# Patient Record
Sex: Male | Born: 1970 | Race: White | Hispanic: No | Marital: Married | Smoking: Former smoker
Health system: Southern US, Community
[De-identification: ages and names within clinical notes are randomized; demographics above are authoritative.]

## PROBLEM LIST (undated history)

## (undated) DIAGNOSIS — Z0282 Encounter for adoption services: Secondary | ICD-10-CM

## (undated) DIAGNOSIS — F1911 Other psychoactive substance abuse, in remission: Secondary | ICD-10-CM

## (undated) HISTORY — PX: LASIK: SHX215

---

## 1999-01-26 ENCOUNTER — Ambulatory Visit: Admission: RE | Admit: 1999-01-26 | Discharge: 1999-01-26 | Payer: Self-pay | Admitting: Otolaryngology

## 1999-02-27 ENCOUNTER — Encounter: Payer: Self-pay | Admitting: Emergency Medicine

## 1999-02-27 ENCOUNTER — Emergency Department (HOSPITAL_COMMUNITY): Admission: EM | Admit: 1999-02-27 | Discharge: 1999-02-27 | Payer: Self-pay | Admitting: Emergency Medicine

## 2000-08-06 ENCOUNTER — Ambulatory Visit (HOSPITAL_BASED_OUTPATIENT_CLINIC_OR_DEPARTMENT_OTHER): Admission: RE | Admit: 2000-08-06 | Discharge: 2000-08-06 | Payer: Self-pay | Admitting: Otolaryngology

## 2000-09-07 ENCOUNTER — Emergency Department (HOSPITAL_COMMUNITY): Admission: EM | Admit: 2000-09-07 | Discharge: 2000-09-07 | Payer: Self-pay | Admitting: Emergency Medicine

## 2003-10-12 ENCOUNTER — Observation Stay (HOSPITAL_COMMUNITY): Admission: AD | Admit: 2003-10-12 | Discharge: 2003-10-13 | Payer: Self-pay | Admitting: Orthopedic Surgery

## 2003-10-12 ENCOUNTER — Encounter (INDEPENDENT_AMBULATORY_CARE_PROVIDER_SITE_OTHER): Payer: Self-pay | Admitting: *Deleted

## 2003-12-12 ENCOUNTER — Emergency Department (HOSPITAL_COMMUNITY): Admission: EM | Admit: 2003-12-12 | Discharge: 2003-12-12 | Payer: Self-pay | Admitting: Emergency Medicine

## 2003-12-22 ENCOUNTER — Emergency Department (HOSPITAL_COMMUNITY): Admission: EM | Admit: 2003-12-22 | Discharge: 2003-12-23 | Payer: Self-pay | Admitting: Emergency Medicine

## 2003-12-23 ENCOUNTER — Ambulatory Visit (HOSPITAL_COMMUNITY): Admission: RE | Admit: 2003-12-23 | Discharge: 2003-12-23 | Payer: Self-pay | Admitting: Emergency Medicine

## 2005-11-21 ENCOUNTER — Emergency Department (HOSPITAL_COMMUNITY): Admission: EM | Admit: 2005-11-21 | Discharge: 2005-11-21 | Payer: Self-pay | Admitting: Emergency Medicine

## 2005-12-23 ENCOUNTER — Emergency Department (HOSPITAL_COMMUNITY): Admission: EM | Admit: 2005-12-23 | Discharge: 2005-12-23 | Payer: Self-pay | Admitting: Emergency Medicine

## 2005-12-26 ENCOUNTER — Emergency Department (HOSPITAL_COMMUNITY): Admission: EM | Admit: 2005-12-26 | Discharge: 2005-12-26 | Payer: Self-pay | Admitting: Emergency Medicine

## 2006-08-26 ENCOUNTER — Emergency Department (HOSPITAL_COMMUNITY): Admission: EM | Admit: 2006-08-26 | Discharge: 2006-08-26 | Payer: Self-pay | Admitting: Emergency Medicine

## 2009-10-24 ENCOUNTER — Emergency Department (HOSPITAL_COMMUNITY): Admission: EM | Admit: 2009-10-24 | Discharge: 2009-10-24 | Payer: Self-pay | Admitting: Emergency Medicine

## 2010-06-10 NOTE — Op Note (Signed)
[facility]  Patient:    [name], [name]                       MRN: [id number] Proc. Date: 08/06/00 Adm. Date:  [id number] Attending:  Carlean Purl                           Operative Report  PREOPERATIVE DIAGNOSIS:  Right preauricular cyst.  POSTOPERATIVE DIAGNOSIS:  Right preauricular cyst.  OPERATION:  Excision of right preauricular cyst.  SURGEON:  Kristine Garbe. Ezzard Standing, M.D.  ANESTHESIA:  Local 1% Xylocaine with 1:100,000 epinephrine.  COMPLICATIONS:  None.  INDICATIONS:  Devon Young is a 40 year old  gentleman who has had a small cyst with a small fistulous track in front of the right tragus.  He is taken to the operating room at this time for an excision of the right preauricular cyst.  DESCRIPTION OF PROCEDURE:  The right preauricular area was prepped with Betadine solution and was then injected with 3 cc of Xylocaine with epinephrine.  The fistulous track and nodular cyst was elliptically excised. A #4-0 chromic suture was used to ligate a branch of the temporal artery.  The defect was then closed with #4-0 chromic sutures subcutaneously, and Steri-Strips on the skin.  DISPOSITION:  [name] tolerated the procedure well, and is discharged home, on Tylenol p.r.n. pain.  Will come to our office if he has any problems. DD:  08/06/00 TD:  08/06/00 Job: [id number]

## 2010-06-10 NOTE — Op Note (Signed)
Devon Young, Devon Young                          ACCOUNT NO.:  [id number]   MEDICAL RECORD NO.:  [id number]                   PATIENT TYPE:  OBV   LOCATION:  1610                                 FACILITY:  [facility]   PHYSICIAN:  Marlowe Kays, M.D.               DATE OF BIRTH:  1971/01/20   DATE OF PROCEDURE:  10/12/2003  DATE OF DISCHARGE:                                 OPERATIVE REPORT   PREOPERATIVE DIAGNOSES:  Herniated nucleus pulposus L5-S1, left.   POSTOPERATIVE DIAGNOSES:  Herniated nucleus pulposus L5-S1, left.   OPERATION:  Microdiskectomy L5-S1, left.   SURGEON:  Marlowe Kays, M.D.   ASSISTANT:  Georges Lynch. Darrelyn Hillock, M.D.   ANESTHESIA:  General.   PATHOLOGY AND JUSTIFICATION FOR PROCEDURE:  Longstanding chronic left leg  pain with some numbness over the anterior thigh.  He had an MRI roughly six  to nine months ago which demonstrated disk abnormality L5-S1 on the left but  more recently he has had more in the way of pain with some numbness over the  anterior thigh and I repeated the MRI on October 09, 2003 which showed a  moderately large left sided disk protrusion increased in size from the  previous study of February 05, 2003  and demonstrating compression of the S1  nerve root.  Accordingly he is here today for surgery.   DESCRIPTION OF PROCEDURE:  Prophylactic antibiotics, satisfactory general  anesthesia, knee chest position on the Andrew's frame, back was prepped with  Duraprep and partially draped in a sterile field with two spinal needles on  the lateral x-ray. I tentatively identified the L5-S1 interspace. The  superior needle was just above the L5-S1 disk and the inferior needle over  the sacrum.  We finished draping the back in a sterile field, Ioban  employed, vertical midline incision.  I dissected the soft tissue off the  lamina of L5 and the sacrum which because of his anatomy we felt clearly  defined and no additional x-ray was anticipated.  I placed  a self retaining  McCullough retractor. I then undermined the fascia over the superior portion  of the sacrum and used a 2 mm first and then 3 mm Kerrison rongeurs doing a  wide foraminotomy.  Then working laterally removing bone on ligamentum  flavum. I then brought in the microscope and completed the lateral  decompression. The S1 nerve root was identified, retracted medially and the  large disk herniation noted. Some veins around it were coagulated with  bipolar cautery. The interspace was opened with a 15 knife blade and a large  amount of disk material came forth under pressure. These two major disk  fragments were identified and removed. I then began removing all disk  material obtainable from the interspace with a combination of nerve hook,  Epstein curette and straight and angled upbite pituitary rongeurs. We worked  over the sacrum and also cephalad carefully removing  disk material until all  obtainable from the interspace was removed.  The nerve root was noted to be  bruised laterally with a slight indentation in it consistent with his pain  and sensory loss.  The wound was then irrigated with sterile saline. He was  given 30 mg of Toradol IV.  Gelfoam soaked in thrombin was placed over the  interspace and around the nerve. Self retaining retractors were removed,  there was no unusual bleeding.  I closed the fascia with interrupted #1  Vicryl and infiltrated the subcutaneous tissue with 0.5%  plain Marcaine.  The closure was completed with interrupted #1 Vicryl in the  subcutaneous tissue deep.  2-0 Vicryl superficially and staples in the skin.  Betadine Adaptic dry sterile dressing were applied. He was gently placed on  his bed and taken to the recovery room in satisfactory condition with no  known complications.      JA/MEDQ  D:  10/12/2003  T:  10/13/2003  Job:  [id number]

## 2010-11-07 LAB — CBC
Hemoglobin: 14.6
MCHC: 33.8
RBC: 4.79

## 2010-11-07 LAB — I-STAT 8, (EC8 V) (CONVERTED LAB)
BUN: 19
Chloride: 105
Glucose, Bld: 137 — ABNORMAL HIGH
Potassium: 4.4
TCO2: 30
pH, Ven: 7.353 — ABNORMAL HIGH

## 2010-11-07 LAB — DIFFERENTIAL
Basophils Relative: 1
Lymphs Abs: 1.8
Monocytes Absolute: 0.5
Monocytes Relative: 9
Neutro Abs: 2.9

## 2010-11-07 LAB — RAPID URINE DRUG SCREEN, HOSP PERFORMED
Amphetamines: NOT DETECTED
Barbiturates: NOT DETECTED
Benzodiazepines: POSITIVE — AB
Cocaine: NOT DETECTED
Opiates: POSITIVE — AB
Tetrahydrocannabinol: NOT DETECTED

## 2012-05-27 ENCOUNTER — Other Ambulatory Visit: Payer: Self-pay | Admitting: Orthopedic Surgery

## 2012-05-27 ENCOUNTER — Encounter (HOSPITAL_COMMUNITY): Payer: Self-pay | Admitting: Pharmacy Technician

## 2012-05-31 ENCOUNTER — Inpatient Hospital Stay (HOSPITAL_COMMUNITY): Admission: RE | Admit: 2012-05-31 | Payer: Self-pay | Source: Ambulatory Visit

## 2012-06-03 ENCOUNTER — Encounter (HOSPITAL_COMMUNITY)
Admission: RE | Admit: 2012-06-03 | Discharge: 2012-06-03 | Disposition: A | Discharge status: Home / Self Care | Payer: BC Managed Care – PPO | Source: Ambulatory Visit | Attending: Orthopedic Surgery | Admitting: Orthopedic Surgery

## 2012-06-03 ENCOUNTER — Encounter (HOSPITAL_COMMUNITY): Payer: Self-pay

## 2012-06-03 DIAGNOSIS — Z789 Other specified health status: Secondary | ICD-10-CM

## 2012-06-03 DIAGNOSIS — Z0282 Encounter for adoption services: Secondary | ICD-10-CM

## 2012-06-03 HISTORY — PX: BACK SURGERY: SHX140

## 2012-06-03 HISTORY — PX: OTHER SURGICAL HISTORY: SHX169

## 2012-06-03 HISTORY — DX: Other psychoactive substance abuse, in remission: F19.11

## 2012-06-03 HISTORY — DX: Encounter for adoption services: Z02.82

## 2012-06-03 HISTORY — DX: Other specified health status: Z78.9

## 2012-06-03 LAB — SURGICAL PCR SCREEN
MRSA, PCR: NEGATIVE
Staphylococcus aureus: POSITIVE — AB

## 2012-06-03 NOTE — Pre-Procedure Instructions (Signed)
06-03-12 1610 Pt. Left voice  Message to inform of Positive Staph aureus PCR screen-ask to return call to confirm. Use Mupirocin as instructed and bring to hospital. W. Tyner Codner,RN.

## 2012-06-03 NOTE — Patient Instructions (Addendum)
[name]  06/03/2012   Your procedure is scheduled on:   5-13 -2014  Report to [facility] at      1:00 PM.  Call this number if you have problems the morning of surgery: [phone]  Or Presurgical Testing [phone](Amya Hlad)     Do not eat food:After Midnight.  May have clear liquids:up to 6 Hours before arrival. Nothing after :  0900 AM  Clear liquids include soda, tea, black coffee, apple or grape juice, broth.  Take these medicines the morning of surgery with A SIP OF WATER: Methadone   Do not wear jewelry, make-up or nail polish.  Do not wear lotions, powders, or perfumes. You may wear deodorant.  Do not shave 12 hours prior to first CHG shower(legs and under arms).(face and neck okay.)  Do not bring valuables to the hospital.  Contacts, dentures or bridgework,body piercing,  may not be worn into surgery.  Leave suitcase in the car. After surgery it may be brought to your room.  For patients admitted to the hospital, checkout time is 11:00 AM the day of discharge.   Patients discharged the day of surgery will not be allowed to drive home. Must have responsible person with you x 24 hours once discharged.  Name and phone number of your driver: marque, rademaker [phone] cell  Special Instructions: CHG(Chlorhedine 4%-"Hibiclens","Betasept","Aplicare") Shower Use Special Wash: see special instructions.(avoid face and genitals)   Please read over the following fact sheets that you were given: MRSA Information.   Failure to follow these instructions may result in Cancellation of your surgery.   Patient signature_______________________________________________________

## 2012-06-04 ENCOUNTER — Encounter (HOSPITAL_COMMUNITY)
Admission: RE | Disposition: A | Discharge status: Home / Self Care | Payer: Self-pay | Source: Ambulatory Visit | Attending: Orthopedic Surgery

## 2012-06-04 ENCOUNTER — Encounter (HOSPITAL_COMMUNITY): Payer: Self-pay | Admitting: *Deleted

## 2012-06-04 ENCOUNTER — Encounter (HOSPITAL_COMMUNITY): Payer: Self-pay | Admitting: Anesthesiology

## 2012-06-04 ENCOUNTER — Ambulatory Visit (HOSPITAL_COMMUNITY): Payer: BC Managed Care – PPO

## 2012-06-04 ENCOUNTER — Observation Stay (HOSPITAL_COMMUNITY)
Admission: RE | Admit: 2012-06-04 | Discharge: 2012-06-06 | Disposition: A | Discharge status: Home / Self Care | Payer: BC Managed Care – PPO | Source: Ambulatory Visit | Attending: Orthopedic Surgery | Admitting: Orthopedic Surgery

## 2012-06-04 ENCOUNTER — Ambulatory Visit: Admit: 2012-06-04 | Payer: Self-pay | Admitting: Orthopedic Surgery

## 2012-06-04 ENCOUNTER — Ambulatory Visit (HOSPITAL_COMMUNITY): Payer: BC Managed Care – PPO | Admitting: Anesthesiology

## 2012-06-04 DIAGNOSIS — M5126 Other intervertebral disc displacement, lumbar region: Principal | ICD-10-CM | POA: Insufficient documentation

## 2012-06-04 DIAGNOSIS — R209 Unspecified disturbances of skin sensation: Secondary | ICD-10-CM | POA: Insufficient documentation

## 2012-06-04 DIAGNOSIS — M79609 Pain in unspecified limb: Secondary | ICD-10-CM | POA: Insufficient documentation

## 2012-06-04 DIAGNOSIS — Z9889 Other specified postprocedural states: Secondary | ICD-10-CM

## 2012-06-04 HISTORY — PX: LUMBAR LAMINECTOMY: SHX95

## 2012-06-04 SURGERY — MICRODISCECTOMY LUMBAR LAMINECTOMY
Anesthesia: General | Site: Back | Laterality: Left | Wound class: Clean

## 2012-06-04 SURGERY — LUMBAR LAMINECTOMY/DECOMPRESSION MICRODISCECTOMY 1 LEVEL
Anesthesia: General | Site: Back | Laterality: Left

## 2012-06-04 MED ORDER — SUCCINYLCHOLINE CHLORIDE 20 MG/ML IJ SOLN
INTRAMUSCULAR | Status: DC | PRN
  Administered 2012-06-04: 160 mg via INTRAVENOUS

## 2012-06-04 MED ORDER — HYDROMORPHONE HCL PF 1 MG/ML IJ SOLN
INTRAMUSCULAR | Status: AC
  Filled 2012-06-04: qty 1

## 2012-06-04 MED ORDER — KETOROLAC TROMETHAMINE 30 MG/ML IJ SOLN: 30.0000 mg | Freq: Once | INTRAMUSCULAR | Status: DC

## 2012-06-04 MED ORDER — DIAZEPAM 5 MG/ML IJ SOLN
5.0000 mg | INTRAMUSCULAR | Status: DC | PRN
  Administered 2012-06-04 (×2): 5 mg via INTRAVENOUS

## 2012-06-04 MED ORDER — DEXTROSE IN LACTATED RINGERS 5 % IV SOLN
INTRAVENOUS | Status: DC
  Administered 2012-06-04: 19:00:00 via INTRAVENOUS

## 2012-06-04 MED ORDER — PHENOL 1.4 % MT LIQD: 1.0000 | OROMUCOSAL | Status: DC | PRN

## 2012-06-04 MED ORDER — OXYCODONE HCL 5 MG PO TABS
5.0000 mg | ORAL_TABLET | ORAL | Status: DC | PRN
  Administered 2012-06-04: 10 mg via ORAL
  Administered 2012-06-05 – 2012-06-06 (×10): 15 mg via ORAL
  Filled 2012-06-04 (×2): qty 3
  Filled 2012-06-04: qty 2
  Filled 2012-06-04 (×8): qty 3

## 2012-06-04 MED ORDER — THROMBIN 5000 UNITS EX SOLR
CUTANEOUS | Status: DC | PRN
  Administered 2012-06-04: 10000 [IU] via TOPICAL

## 2012-06-04 MED ORDER — EPHEDRINE SULFATE 50 MG/ML IJ SOLN
INTRAMUSCULAR | Status: DC | PRN
  Administered 2012-06-04: 10 mg via INTRAVENOUS
  Administered 2012-06-04: 5 mg via INTRAVENOUS

## 2012-06-04 MED ORDER — SODIUM CHLORIDE 0.9 % IV SOLN: 250.0000 mL | INTRAVENOUS | Status: DC

## 2012-06-04 MED ORDER — DEXTROSE 5 % IV SOLN
500.0000 mg | Freq: Four times a day (QID) | INTRAVENOUS | Status: DC | PRN
  Filled 2012-06-04: qty 5

## 2012-06-04 MED ORDER — HEMOSTATIC AGENTS (NO CHARGE) OPTIME
TOPICAL | Status: DC | PRN
  Administered 2012-06-04: 1 via TOPICAL

## 2012-06-04 MED ORDER — ONDANSETRON HCL 4 MG/2ML IJ SOLN
INTRAMUSCULAR | Status: DC | PRN
  Administered 2012-06-04: 4 mg via INTRAVENOUS

## 2012-06-04 MED ORDER — KCL IN DEXTROSE-NACL 20-5-0.9 MEQ/L-%-% IV SOLN
INTRAVENOUS | Status: DC
  Administered 2012-06-04 – 2012-06-05 (×2): via INTRAVENOUS
  Filled 2012-06-04 (×6): qty 1000

## 2012-06-04 MED ORDER — ACETAMINOPHEN 10 MG/ML IV SOLN
INTRAVENOUS | Status: AC
  Filled 2012-06-04: qty 100

## 2012-06-04 MED ORDER — LACTATED RINGERS IV SOLN
INTRAVENOUS | Status: DC | PRN
  Administered 2012-06-04 (×2): via INTRAVENOUS

## 2012-06-04 MED ORDER — MEPERIDINE HCL 50 MG/ML IJ SOLN
6.2500 mg | INTRAMUSCULAR | Status: DC | PRN
  Administered 2012-06-04 (×2): 12.5 mg via INTRAVENOUS

## 2012-06-04 MED ORDER — ONDANSETRON HCL 4 MG/2ML IJ SOLN: 4.0000 mg | INTRAMUSCULAR | Status: DC | PRN

## 2012-06-04 MED ORDER — MENTHOL 3 MG MT LOZG: 1.0000 | LOZENGE | OROMUCOSAL | Status: DC | PRN

## 2012-06-04 MED ORDER — GLYCOPYRROLATE 0.2 MG/ML IJ SOLN
INTRAMUSCULAR | Status: DC | PRN
  Administered 2012-06-04: 0.4 mg via INTRAVENOUS

## 2012-06-04 MED ORDER — KETAMINE HCL 10 MG/ML IJ SOLN
INTRAMUSCULAR | Status: DC | PRN
  Administered 2012-06-04: 20 mg via INTRAVENOUS

## 2012-06-04 MED ORDER — METHOCARBAMOL 500 MG PO TABS
500.0000 mg | ORAL_TABLET | Freq: Four times a day (QID) | ORAL | Status: DC | PRN
  Administered 2012-06-05 (×4): 500 mg via ORAL
  Filled 2012-06-04 (×4): qty 1

## 2012-06-04 MED ORDER — MUPIROCIN 2 % EX OINT
TOPICAL_OINTMENT | CUTANEOUS | Status: AC
  Filled 2012-06-04: qty 22

## 2012-06-04 MED ORDER — SODIUM CHLORIDE 0.9 % IJ SOLN: 3.0000 mL | INTRAMUSCULAR | Status: DC | PRN

## 2012-06-04 MED ORDER — PROPOFOL 10 MG/ML IV EMUL
INTRAVENOUS | Status: DC | PRN
  Administered 2012-06-04: 200 mg via INTRAVENOUS

## 2012-06-04 MED ORDER — METHADONE HCL 10 MG PO TABS
100.0000 mg | ORAL_TABLET | Freq: Every day | ORAL | Status: DC
  Administered 2012-06-05 – 2012-06-06 (×2): 100 mg via ORAL
  Filled 2012-06-04: qty 1
  Filled 2012-06-04: qty 10
  Filled 2012-06-04: qty 9

## 2012-06-04 MED ORDER — CEFAZOLIN SODIUM-DEXTROSE 2-3 GM-% IV SOLR
INTRAVENOUS | Status: AC
  Filled 2012-06-04: qty 50

## 2012-06-04 MED ORDER — DEXTROSE 5 % IV SOLN
3.0000 g | INTRAVENOUS | Status: DC
  Administered 2012-06-04: 3 g via INTRAVENOUS

## 2012-06-04 MED ORDER — METHADONE HCL 10 MG/ML PO CONC: 100.0000 mg | Freq: Every day | ORAL | Status: DC

## 2012-06-04 MED ORDER — OXYCODONE HCL 5 MG/5ML PO SOLN
5.0000 mg | Freq: Once | ORAL | Status: DC | PRN
  Filled 2012-06-04: qty 5

## 2012-06-04 MED ORDER — SODIUM CHLORIDE 0.9 % IJ SOLN: 3.0000 mL | Freq: Two times a day (BID) | INTRAMUSCULAR | Status: DC

## 2012-06-04 MED ORDER — HYDROMORPHONE HCL PF 1 MG/ML IJ SOLN
0.2500 mg | INTRAMUSCULAR | Status: DC | PRN
  Administered 2012-06-04 (×4): 0.5 mg via INTRAVENOUS

## 2012-06-04 MED ORDER — LACTATED RINGERS IV SOLN
INTRAVENOUS | Status: DC
  Administered 2012-06-04: 1000 mL via INTRAVENOUS

## 2012-06-04 MED ORDER — CISATRACURIUM BESYLATE (PF) 10 MG/5ML IV SOLN
INTRAVENOUS | Status: DC | PRN
  Administered 2012-06-04 (×2): 1 mg via INTRAVENOUS
  Administered 2012-06-04: 10 mg via INTRAVENOUS
  Administered 2012-06-04: 1 mg via INTRAVENOUS

## 2012-06-04 MED ORDER — MIDAZOLAM HCL 5 MG/5ML IJ SOLN
INTRAMUSCULAR | Status: DC | PRN
  Administered 2012-06-04: 2 mg via INTRAVENOUS

## 2012-06-04 MED ORDER — HYDROMORPHONE HCL PF 1 MG/ML IJ SOLN
INTRAMUSCULAR | Status: DC | PRN
  Administered 2012-06-04: 1 mg via INTRAVENOUS
  Administered 2012-06-04 (×2): 0.5 mg via INTRAVENOUS

## 2012-06-04 MED ORDER — OXYCODONE HCL 5 MG PO TABS: 5.0000 mg | ORAL_TABLET | Freq: Once | ORAL | Status: DC | PRN

## 2012-06-04 MED ORDER — ADULT MULTIVITAMIN W/MINERALS CH
1.0000 | ORAL_TABLET | Freq: Every day | ORAL | Status: DC
  Administered 2012-06-04 – 2012-06-05 (×2): 1 via ORAL
  Filled 2012-06-04 (×3): qty 1

## 2012-06-04 MED ORDER — 0.9 % SODIUM CHLORIDE (POUR BTL) OPTIME
TOPICAL | Status: DC | PRN
  Administered 2012-06-04: 1000 mL

## 2012-06-04 MED ORDER — THROMBIN 5000 UNITS EX SOLR
CUTANEOUS | Status: AC
  Filled 2012-06-04: qty 10000

## 2012-06-04 MED ORDER — NEOSTIGMINE METHYLSULFATE 1 MG/ML IJ SOLN
INTRAMUSCULAR | Status: DC | PRN
  Administered 2012-06-04: 3 mg via INTRAVENOUS

## 2012-06-04 MED ORDER — DIAZEPAM 5 MG/ML IJ SOLN
INTRAMUSCULAR | Status: AC
  Filled 2012-06-04: qty 2

## 2012-06-04 MED ORDER — MEPERIDINE HCL 50 MG/ML IJ SOLN
INTRAMUSCULAR | Status: AC
  Filled 2012-06-04: qty 1

## 2012-06-04 MED ORDER — HYDROMORPHONE HCL PF 1 MG/ML IJ SOLN
0.5000 mg | INTRAMUSCULAR | Status: DC | PRN
  Administered 2012-06-04 – 2012-06-05 (×4): 1 mg via INTRAVENOUS
  Administered 2012-06-05: 0.5 mg via INTRAVENOUS
  Administered 2012-06-05 – 2012-06-06 (×5): 1 mg via INTRAVENOUS
  Filled 2012-06-04 (×10): qty 1

## 2012-06-04 MED ORDER — CEFAZOLIN SODIUM-DEXTROSE 2-3 GM-% IV SOLR
2.0000 g | Freq: Three times a day (TID) | INTRAVENOUS | Status: AC
  Administered 2012-06-05 (×2): 2 g via INTRAVENOUS
  Filled 2012-06-04 (×2): qty 50

## 2012-06-04 MED ORDER — POVIDONE-IODINE 10 % EX SOLN
CUTANEOUS | Status: DC | PRN
  Administered 2012-06-04: 1 via TOPICAL

## 2012-06-04 MED ORDER — CEFAZOLIN SODIUM 1-5 GM-% IV SOLN
INTRAVENOUS | Status: AC
  Filled 2012-06-04: qty 50

## 2012-06-04 MED ORDER — FENTANYL CITRATE 0.05 MG/ML IJ SOLN
INTRAMUSCULAR | Status: DC | PRN
  Administered 2012-06-04: 25 ug via INTRAVENOUS
  Administered 2012-06-04: 100 ug via INTRAVENOUS
  Administered 2012-06-04 (×3): 25 ug via INTRAVENOUS

## 2012-06-04 MED ORDER — PROMETHAZINE HCL 25 MG/ML IJ SOLN: 6.2500 mg | INTRAMUSCULAR | Status: DC | PRN

## 2012-06-04 MED ORDER — POVIDONE-IODINE 7.5 % EX SOLN: Freq: Once | CUTANEOUS | Status: DC

## 2012-06-04 MED ORDER — MUPIROCIN 2 % EX OINT
TOPICAL_OINTMENT | Freq: Two times a day (BID) | CUTANEOUS | Status: DC
  Administered 2012-06-04: 14:00:00 via NASAL

## 2012-06-04 SURGICAL SUPPLY — 44 items
BAG ZIPLOCK 12X15 (MISCELLANEOUS) ×2 IMPLANT
BENZOIN TINCTURE PRP APPL 2/3 (GAUZE/BANDAGES/DRESSINGS) ×2 IMPLANT
BUR EGG DIAMOND 4.0 (BURR) ×2 IMPLANT
CLEANER TIP ELECTROSURG 2X2 (MISCELLANEOUS) ×2 IMPLANT
CLOTH BEACON ORANGE TIMEOUT ST (SAFETY) ×2 IMPLANT
CONT SPEC 4OZ CLIKSEAL STRL BL (MISCELLANEOUS) ×2 IMPLANT
DRAIN PENROSE 18X1/4 LTX STRL (WOUND CARE) IMPLANT
DRAPE LG THREE QUARTER DISP (DRAPES) ×2 IMPLANT
DRAPE MICROSCOPE LEICA (MISCELLANEOUS) ×2 IMPLANT
DRAPE POUCH INSTRU U-SHP 10X18 (DRAPES) ×2 IMPLANT
DRAPE SURG 17X11 SM STRL (DRAPES) ×2 IMPLANT
DRSG ADAPTIC 3X8 NADH LF (GAUZE/BANDAGES/DRESSINGS) ×2 IMPLANT
DRSG PAD ABDOMINAL 8X10 ST (GAUZE/BANDAGES/DRESSINGS) ×2 IMPLANT
DURAPREP 26ML APPLICATOR (WOUND CARE) ×2 IMPLANT
ELECT BLADE TIP CTD 4 INCH (ELECTRODE) ×2 IMPLANT
ELECT REM PT RETURN 9FT ADLT (ELECTROSURGICAL) ×2
ELECTRODE REM PT RTRN 9FT ADLT (ELECTROSURGICAL) ×1 IMPLANT
GLOVE BIO SURGEON STRL SZ8 (GLOVE) ×4 IMPLANT
GLOVE BIOGEL PI IND STRL 8 (GLOVE) ×2 IMPLANT
GLOVE BIOGEL PI INDICATOR 8 (GLOVE) ×2
GLOVE ECLIPSE 8.0 STRL XLNG CF (GLOVE) ×4 IMPLANT
GLOVE INDICATOR 8.0 STRL GRN (GLOVE) ×2 IMPLANT
GOWN BRE IMP PREV XXLGXLNG (GOWN DISPOSABLE) ×4 IMPLANT
GOWN STRL REIN XL XLG (GOWN DISPOSABLE) ×4 IMPLANT
KIT BASIN OR (CUSTOM PROCEDURE TRAY) ×2 IMPLANT
KIT POSITIONING SURG ANDREWS (MISCELLANEOUS) ×2 IMPLANT
MANIFOLD NEPTUNE II (INSTRUMENTS) ×2 IMPLANT
NEEDLE SPNL 18GX3.5 QUINCKE PK (NEEDLE) ×6 IMPLANT
NS IRRIG 1000ML POUR BTL (IV SOLUTION) ×2 IMPLANT
PATTIES SURGICAL .5 X.5 (GAUZE/BANDAGES/DRESSINGS) IMPLANT
PATTIES SURGICAL .75X.75 (GAUZE/BANDAGES/DRESSINGS) IMPLANT
PATTIES SURGICAL 1X1 (DISPOSABLE) IMPLANT
POSITIONER SURGICAL ARM (MISCELLANEOUS) ×2 IMPLANT
SPONGE GAUZE 4X4 12PLY (GAUZE/BANDAGES/DRESSINGS) ×2 IMPLANT
SPONGE LAP 4X18 X RAY DECT (DISPOSABLE) ×4 IMPLANT
SPONGE SURGIFOAM ABS GEL 100 (HEMOSTASIS) ×2 IMPLANT
STAPLER VISISTAT 35W (STAPLE) ×2 IMPLANT
SUT VIC AB 1 CT1 27 (SUTURE) ×2
SUT VIC AB 1 CT1 27XBRD ANTBC (SUTURE) ×2 IMPLANT
SUT VIC AB 2-0 CT1 27 (SUTURE) ×2
SUT VIC AB 2-0 CT1 27XBRD (SUTURE) ×2 IMPLANT
TAPE CLOTH SURG 6X10 WHT LF (GAUZE/BANDAGES/DRESSINGS) ×2 IMPLANT
TRAY LAMINECTOMY (CUSTOM PROCEDURE TRAY) ×2 IMPLANT
WATER STERILE IRR 1500ML POUR (IV SOLUTION) ×2 IMPLANT

## 2012-06-04 NOTE — Anesthesia Preprocedure Evaluation (Addendum)
Anesthesia Evaluation  Patient identified by MRN, date of birth, ID band Patient awake    Reviewed: Allergy & Precautions, H&P , NPO status , Patient's Chart, lab work & pertinent test results  Airway Mallampati: I TM Distance: >3 FB Neck ROM: Full    Dental  (+) Dental Advisory Given   Pulmonary neg pulmonary ROS,  breath sounds clear to auscultation        Cardiovascular negative cardio ROS  Rhythm:Regular Rate:Normal     Neuro/Psych negative neurological ROS  negative psych ROS   GI/Hepatic negative GI ROS, Neg liver ROS,   Endo/Other  negative endocrine ROS  Renal/GU negative Renal ROS     Musculoskeletal negative musculoskeletal ROS (+)   Abdominal   Peds  Hematology negative hematology ROS (+)   Anesthesia Other Findings   Reproductive/Obstetrics                          Anesthesia Physical Anesthesia Plan  ASA: II  Anesthesia Plan: General   Post-op Pain Management:    Induction: Intravenous  Airway Management Planned: Oral ETT  Additional Equipment:   Intra-op Plan:   Post-operative Plan: Extubation in OR  Informed Consent: I have reviewed the patients History and Physical, chart, labs and discussed the procedure including the risks, benefits and alternatives for the proposed anesthesia with the patient or authorized representative who has indicated his/her understanding and acceptance.   Dental advisory given  Plan Discussed with: CRNA  Anesthesia Plan Comments:         Anesthesia Quick Evaluation

## 2012-06-04 NOTE — Progress Notes (Signed)
Patient arrived in short stay for his surgery saying he was up all night with diarrhea and feels very weak. He does not feel like having his surgery today. Temp 98. [place] to come and see the patient.
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
## 2012-06-04 NOTE — Anesthesia Postprocedure Evaluation (Signed)
Anesthesia Post Note  Patient: Devon Young  Procedure(s) Performed: Procedure(s) (LRB): MICRODISCECTOMY L5-S1 LEFT    (1 LEVEL) (Left)  Anesthesia type: General  Patient location: PACU  Post pain: Pain level controlled  Post assessment: Post-op Vital signs reviewed  Last Vitals: BP 103/68  Pulse 94  Temp(Src) 37.2 C (Oral)  Resp 14  SpO2 96%  Post vital signs: Reviewed  Level of consciousness: sedated  Complications: No apparent anesthesia complications

## 2012-06-04 NOTE — Transfer of Care (Signed)
Immediate Anesthesia Transfer of Care Note  Patient: Devon Young  Procedure(s) Performed: Procedure(s): MICRODISCECTOMY L5-S1 LEFT    (1 LEVEL) (Left)  Patient Location: PACU  Anesthesia Type:General  Level of Consciousness: awake, alert , oriented and patient cooperative  Airway & Oxygen Therapy: Patient Spontanous Breathing and Patient connected to face mask oxygen  Post-op Assessment: Report given to PACU RN and Post -op Vital signs reviewed and stable  Post vital signs: stable  Complications: No apparent anesthesia complications

## 2012-06-05 ENCOUNTER — Encounter (HOSPITAL_COMMUNITY): Payer: Self-pay | Admitting: Orthopedic Surgery

## 2012-06-05 MED ORDER — SENNOSIDES-DOCUSATE SODIUM 8.6-50 MG PO TABS
1.0000 | ORAL_TABLET | Freq: Two times a day (BID) | ORAL | Status: DC
  Administered 2012-06-05 – 2012-06-06 (×3): 1 via ORAL
  Filled 2012-06-05 (×4): qty 1

## 2012-06-05 MED ORDER — DOCUSATE SODIUM 100 MG PO CAPS: 100.0000 mg | ORAL_CAPSULE | Freq: Two times a day (BID) | ORAL | Status: DC

## 2012-06-05 NOTE — Care Management Note (Signed)
    Page 1 of 2   06/06/2012     4:14:21 PM   CARE MANAGEMENT NOTE 06/06/2012  Patient:  Devon Young,Devon Young   Account Number:  [id number]  Date Initiated:  06/05/2012  Documentation initiated by:  Colleen Can  Subjective/Objective Assessment:   dx recurrent herniated disc L5-S1; microdiskectomy     Action/Plan:   CM spoke with patient and pt's mother. Plans are for patient to go to his mother's home where she will be caregiver upon his discharge from hospital. He already has RW but will need 3n1. Wants HH agency in network .   Anticipated DC Date:  06/06/2012   Anticipated DC Plan:  HOME W HOME HEALTH SERVICES  In-house referral  Clinical Social Worker      DC Planning Services  CM consult      PAC Choice  DURABLE MEDICAL EQUIPMENT  HOME HEALTH   Choice offered to / List presented to:  C-1 Patient   DME arranged  3-N-1      DME agency  [facility]     HH arranged  HH-2 PT      [facility] agency  [facility]   Status of service:  Completed, signed off Medicare Important Message given?   (If response is "NO", the following Medicare IM given date fields will be blank) Date Medicare IM given:   Date Additional Medicare IM given:    Discharge Disposition:  HOME W HOME HEALTH SERVICES  Per UR Regulation:  Reviewed for med. necessity/level of care/duration of stay  If discussed at Long Length of Stay Meetings, dates discussed:    Comments:  06/06/2012 Colleen Can BSN RN CCM [phone] Genevieve Norlander will provide HHpt services for patient. DME has been delivered to pt's room. Pt discharged today

## 2012-06-05 NOTE — Evaluation (Signed)
Occupational Therapy Evaluation Patient Details Name: Devon Young MRN: [id number] DOB: July 23, 1970 Today's Date: 06/05/2012 Time: 1191-4782 OT Time Calculation (min): 32 min  OT Assessment / Plan / Recommendation Clinical Impression  This 42 year old man was admitted for L5-S1 decompression.  He has a h/o previous surgery 10 years ago.  He was limited by pain at eval and back education was initiated.  He will benefit from continued OT to continue education for safely performing adls and bathroom transfers and to increase independence with these.    OT Assessment  Patient needs continued OT Services    Follow Up Recommendations  No OT follow up (likely.  Needs continued OT in acute)    Barriers to Discharge      Equipment Recommendations  3 in 1 bedside comode    Recommendations for Other Services    Frequency  Min 2X/week    Precautions / Restrictions Precautions Precautions: Back Restrictions Weight Bearing Restrictions: Yes Other Position/Activity Restrictions: wbat   Pertinent Vitals/Pain 7 to 10/10 back; repositioned and pain medication requested.  Pt was premedicated    ADL  Toilet Transfer: Simulated;Minimal assistance (bed to chair, extra time with RW) Toilet Transfer Method: Stand pivot Transfers/Ambulation Related to ADLs: Pt anxious and still with 7- 10 pain.  Moved very slowly but well, min A overall for bed mobility and transfer ADL Comments: Began education for back precautions with adls.  Will need reinforcement.  ADLs limited due to pain.  Pt left sitting in chair and shoulders elevated/guarded.  RN called for pain medication.  Pt requires overall mod A for UB adls and max A for LB adls at this time.      OT Diagnosis: Generalized weakness  OT Problem List: Decreased strength;Decreased activity tolerance;Pain;Decreased knowledge of use of DME or AE;Decreased knowledge of precautions OT Treatment Interventions: Self-care/ADL training;DME and/or AE  instruction;Patient/family education   OT Goals Acute Rehab OT Goals OT Goal Formulation: With patient Time For Goal Achievement: 06/12/12 Potential to Achieve Goals: Good ADL Goals Pt Will Perform Lower Body Bathing: with min assist;Sit to stand from chair;with adaptive equipment ADL Goal: Lower Body Bathing - Progress: Goal set today Pt Will Perform Lower Body Dressing: with min assist;Sit to stand from chair;with adaptive equipment ADL Goal: Lower Body Dressing - Progress: Goal set today Pt Will Transfer to Toilet: with supervision;Ambulation;3-in-1 ADL Goal: Toilet Transfer - Progress: Goal set today Pt Will Perform Toileting - Hygiene: with supervision;Standing at 3-in-1/toilet;with adaptive equipment (prn) ADL Goal: Toileting - Hygiene - Progress: Goal set today Miscellaneous OT Goals Miscellaneous OT Goal #1: Pt will verbalize tub readiness/sequence OT Goal: Miscellaneous Goal #1 - Progress: Goal set today  Visit Information  Last OT Received On: 06/05/12 Assistance Needed: +1    Subjective Data  Subjective: It still hurts.  I can still feel the nerve Patient Stated Goal: get better, decrease pain   Prior Functioning     Home Living Lives With: Alone Available Help at Discharge: Family Type of Home: House Home Access: Stairs to enter Secretary/administrator of Steps: 2 Home Layout: One level Bathroom Shower/Tub: Engineer, manufacturing systems: Standard Additional Comments: plans to stay with mother Prior Function Level of Independence: Independent         Vision/Perception     Cognition  Cognition Arousal/Alertness: Awake/alert Behavior During Therapy: Anxious Overall Cognitive Status: Within Functional Limits for tasks assessed    Extremity/Trunk Assessment Right Upper Extremity Assessment RUE ROM/Strength/Tone: [facility] for tasks assessed Left Upper Extremity Assessment  LUE ROM/Strength/Tone: WFL for tasks assessed     Mobility Bed Mobility Bed  Mobility: Left Sidelying to Sit;Rolling Left Rolling Left: 4: Min assist;With rail Left Sidelying to Sit: 4: Min assist;With rails;HOB flat Transfers Transfers: Sit to Stand Sit to Stand: 4: Min assist     Exercise     Balance     End of Session OT - End of Session Activity Tolerance: Patient limited by pain Patient left: in chair;with call bell/phone within reach Nurse Communication: Patient requests pain meds  GO Functional Assessment Tool Used: clinical judgment Functional Limitation: Self care Self Care Current Status (W0981): At least 80 percent but less than 100 percent impaired, limited or restricted Self Care Goal Status (X9147): At least 20 percent but less than 40 percent impaired, limited or restricted   [facility] 06/05/2012, 9:23 AM Marica Otter, OTR/L [phone] 06/05/2012

## 2012-06-05 NOTE — Progress Notes (Signed)
Subjective: 1 Day Post-Op Procedure(s) (LRB): MICRODISCECTOMY L5-S1 LEFT    (1 LEVEL) (Left) Patient reports pain as 6 on 0-10 scale.    Objective: Vital signs in last 24 hours: Temp:  [98 F (36.7 C)-100.6 F (38.1 C)] 100.6 F (38.1 C) (05/14 0951) Pulse Rate:  [73-100] 93 (05/14 0951) Resp:  [12-18] 16 (05/14 0951) BP: (99-129)/(61-76) 99/61 mmHg (05/14 0951) SpO2:  [94 %-100 %] 95 % (05/14 0951) Weight:  [111.131 kg (245 lb)] 111.131 kg (245 lb) (05/14 0630)  Intake/Output from previous day: 05/13 0701 - 05/14 0700 In: 2680 [I.V.:2630; IV Piggyback:50] Out: 875 [Urine:850; Blood:25] Intake/Output this shift: Total I/O In: 120 [P.O.:120] Out: 300 [Urine:300]  No results found for this basename: HGB,  in the last 72 hours No results found for this basename: WBC, RBC, HCT, PLT,  in the last 72 hours No results found for this basename: NA, K, CL, CO2, BUN, CREATININE, GLUCOSE, CALCIUM,  in the last 72 hours No results found for this basename: LABPT, INR,  in the last 72 hours  Neurologically intact Neurovascular intact Intact pulses distally Dorsiflexion/Plantar flexion intact  Assessment/Plan: 1 Day Post-Op Procedure(s) (LRB): MICRODISCECTOMY L5-S1 LEFT    (1 LEVEL) (Left) Advance diet Up with therapy D/C IV fluids Plan for discharge tomorrow.  Today he is unsure if he can manage at home. He will need several more attempts at ambulation to become more confident.  Dressing dry, Some residual numbness in small toe. Motor OK.  Zaydon Kinser III,Berdell Nevitt L 06/05/2012, 12:17 PM

## 2012-06-05 NOTE — Progress Notes (Signed)
CSW consulted for SNF placement. PN reviewed . PT is recommending HHPT following hospital d/c. RNCM will assist with d/c planning. CSW is available to assist with d/c planning if plan changes and SNF is needed.  Cori Razor LCSW [phone]

## 2012-06-05 NOTE — Op Note (Signed)
NAMEJOSSUE, Devon Young              ACCOUNT NO.:  [id number]  MEDICAL RECORD NO.:  [id number]  LOCATION:  1606                         FACILITY:  [facility]  PHYSICIAN:  Marlowe Kays, M.D.  DATE OF BIRTH:  1970-09-27  DATE OF PROCEDURE:  06/04/2012 DATE OF DISCHARGE:                              OPERATIVE REPORT   PREOPERATIVE DIAGNOSIS:  Recurrent herniated nucleus pulposus L5-S1, left.  POSTOPERATIVE DIAGNOSIS:  Recurrent herniated nucleus pulposus L5-S1, left.  OPERATION:  Microdiskectomy L5-S1, left with excision of recurrent herniated nucleus pulposus.  SURGEON:  Marlowe Kays, M.D.  ASSISTANT:  Georges Lynch. Darrelyn Hillock, M.D.  ANESTHESIA:  General.  PATHOLOGY AND JUSTIFICATION FOR PROCEDURE:  I performed his original surgery 10 years ago, and he had done well until recently when he developed back and left leg pain with numbness in the little toe.  MRI demonstrated recurrent disk herniation, with compression of the L5-S1 disk.  His other disk looked normal.  Consequently, he is here today for the above-mentioned surgery because of the significant pain and the neurologic deficit.  PROCEDURE:  Prophylactic antibiotics, satisfied general anesthesia, knee- chest position on the Andrews frame, back was prepped with DuraPrep, draped in sterile field.  A time-out was performed.  I went through the old surgical incision and identified the 2 spinous processes in the depths of the wound, which I tagged with a Kocher clamps, lateral x-ray identified them as L5 and sacrum.  The interspace was noted to be just above the superior clamp.  I then continued dissecting all soft tissue, identifying the lamina of the L5 and the sacrum.  I cleaned this off with a large curette, and then with a smaller curette undermined both the inferior portion of the remaining L5 lamina and the superior sacrum, and with 2 mm Kerrison rongeur went around the parameter until we had freed up the dura.  We did  enough of dissection to allow [place] to mobilize the dura medially and then took a 2nd x-ray with markers to identify exactly the L5-S1 interspace.  This protected the S1 nerve root.  I identified the fibrin disk with a spinal needle and then opened it with a 15 knife blade, and we then began removing a large amount of disk material with combination of nerve hook Epstein curette and micropituitary until all the disk material we could obtain was removed. At the conclusion of this, the S1 nerve root was freely mobile and _the S1 foramen_________ was widely patent.  We irrigated the wound well with sterile saline and placed Gelfoam soaked in thrombin over the interspace and around the dura.  I then removed the self-retaining McCullough retractors, and closed the wound in layers with interrupted #1 Vicryl in the paralumbar muscle and fascia, and deep subcutaneous tissue, 2-0 Vicryl superficial subcutaneous tissue, and staples in the skin.  Betadine, Adaptic, dry sterile dressing were applied.  He tolerated the procedure well at the time of this dictation.  He was on his way to recovery room in satisfactory condition with no known complications.  We used the microscope through most of the case.          ______________________________ Marlowe Kays, M.D.  JA/MEDQ  D:  06/04/2012  T:  06/05/2012  Job:  [id number]

## 2012-06-05 NOTE — Evaluation (Signed)
Physical Therapy Evaluation Patient Details Name: Devon Young MRN: [id number] DOB: 12-Jul-1970 Today's Date: 06/05/2012 Time: 5284-1324 PT Time Calculation (min): 22 min  PT Assessment / Plan / Recommendation Clinical Impression  42 yo male adm. 06/04/12 for decompression of L5-S1. previous back surgery same level for .HNP. Pt ambulated in hall x 150 '. Pt has most difficulty with transitional movements for sit/stand and supine/sit/supine. Pt will benefit from PT while in acute care. Pt to check on RW availability.     PT Assessment  Patient needs continued PT services    Follow Up Recommendations  Home health PT    Does the patient have the potential to tolerate intense rehabilitation      Barriers to Discharge        Equipment Recommendations  Rolling walker with 5" wheels    Recommendations for Other Services     Frequency 7X/week    Precautions / Restrictions Precautions Precautions: Back Restrictions Weight Bearing Restrictions: Yes Other Position/Activity Restrictions: wbat   Pertinent Vitals/Pain Back of L thigh 8 shooting      Mobility  Bed Mobility Bed Mobility: Sit to Sidelying Left Rolling Left: 4: Min assist;With rail Left Sidelying to Sit: 4: Min assist;With rails;HOB flat Sit to Sidelying Left: 4: Min assist;With rail Details for Bed Mobility Assistance: assistance to get legs onto bed, used RW parked beside bed as rail  Transfers Sit to Stand: 3: Mod assist;From chair/3-in-1;With armrests;With upper extremity assist Details for Transfer Assistance: much difficulty standing up from recliner. Ambulation/Gait Ambulation/Gait Assistance: 4: Min assist Ambulation Distance (Feet): 150 Feet Assistive device: Rolling walker Ambulation/Gait Assistance Details: pt relies on UE support  to decrease pain in back. Gait Pattern: Step-through pattern Gait velocity: decr.    Exercises     PT Diagnosis: Difficulty walking;Acute pain  PT Problem List:  Decreased activity tolerance;Decreased mobility;Decreased knowledge of use of DME;Decreased knowledge of precautions;Pain PT Treatment Interventions: DME instruction;Gait training;Stair training;Functional mobility training;Therapeutic activities;Therapeutic exercise;Patient/family education   PT Goals Acute Rehab PT Goals PT Goal Formulation: With patient Time For Goal Achievement: 06/08/12 Potential to Achieve Goals: Good Pt will Roll Supine to Right Side: with modified independence PT Goal: Rolling Supine to Right Side - Progress: Goal set today Pt will Roll Supine to Left Side: with modified independence PT Goal: Rolling Supine to Left Side - Progress: Goal set today Pt will go Supine/Side to Sit: with modified independence PT Goal: Supine/Side to Sit - Progress: Goal set today Pt will go Sit to Supine/Side: with modified independence PT Goal: Sit to Supine/Side - Progress: Goal set today Pt will go Sit to Stand: with modified independence PT Goal: Sit to Stand - Progress: Goal set today Pt will go Stand to Sit: with modified independence PT Goal: Stand to Sit - Progress: Goal set today Pt will Ambulate: >150 feet;with modified independence;with rolling walker PT Goal: Ambulate - Progress: Goal set today Pt will Go Up / Down Stairs: 1-2 stairs;with supervision;with least restrictive assistive device PT Goal: Up/Down Stairs - Progress: Goal set today Additional Goals Additional Goal #1: demo 3/3 back precautions PT Goal: Additional Goal #1 - Progress: Goal set today  Visit Information  Last PT Received On: 06/05/12 Assistance Needed: +1    Subjective Data  Subjective: I still feel that pain in the back of my leg. last time it was gone instantly. Patient Stated Goal: to not have pain.   Prior Functioning  Home Living Lives With: Alone Available Help at Discharge: Family Type of  Home: House Home Access: Stairs to enter [employer] of Steps: 2 Home Layout: One  level Bathroom Shower/Tub: Network engineer:  (will see if RW. bsc available.) Additional Comments: plans to stay with mother Prior Function Level of Independence: Independent Able to Take Stairs?: Yes Driving: Yes Communication Communication: No difficulties    Cognition  Cognition Arousal/Alertness: Awake/alert Behavior During Therapy: Anxious Overall Cognitive Status: Within Functional Limits for tasks assessed    Extremity/Trunk Assessment Right Upper Extremity Assessment RUE ROM/Strength/Tone: [facility] for tasks assessed Left Upper Extremity Assessment LUE ROM/Strength/Tone: WFL for tasks assessed Right Lower Extremity Assessment RLE ROM/Strength/Tone: [facility] for tasks assessed Left Lower Extremity Assessment LLE ROM/Strength/Tone: Deficits LLE ROM/Strength/Tone Deficits: dorsiflexion 4/5 LLE Sensation: Deficits LLE Sensation Deficits: reports that foot feels numb.   Balance    End of Session PT - End of Session Activity Tolerance: Patient limited by pain Patient left: in bed;with call bell/phone within reach Nurse Communication: Mobility status  GP     Rada Hay 06/05/2012, 11:14 AM

## 2012-06-05 NOTE — Progress Notes (Signed)
Physical Therapy Treatment Patient Details Name: Devon Young MRN: [id number] DOB: November 29, 1970 Today's Date: 06/05/2012 Time: 4098-1191 PT Time Calculation (min): 26 min  PT Assessment / Plan / Recommendation Comments on Treatment Session  pt. reports decreased discomfort and feels improved. continue PT. Pt will borrow RW and 3in1.    Follow Up Recommendations  Home health PT     Does the patient have the potential to tolerate intense rehabilitation     Barriers to Discharge        Equipment Recommendations  Rolling walker with 5" wheels    Recommendations for Other Services    Frequency 7X/week   Plan Discharge plan remains appropriate;Frequency remains appropriate    Precautions / Restrictions Precautions Precautions: Back   Pertinent Vitals/Pain     Mobility  Bed Mobility Bed Mobility: Left Sidelying to Sit Left Sidelying to Sit: 4: Min assist;With rails;HOB flat Sit to Sidelying Left: 4: Min assist;With rail Details for Bed Mobility Assistance: pt able to manage getting self to sit up. cues for precautions. Transfers Transfers: Sit to Stand;Stand to Sit Sit to Stand: From bed;4: Min assist Stand to Sit: 4: Min guard;To chair/3-in-1;With armrests Details for Transfer Assistance: cues for use of UE's, pt has difficulty initiating standing, knees nearly buckle Ambulation/Gait Ambulation/Gait Assistance: 4: Min guard Ambulation Distance (Feet): 200 Feet Assistive device: Rolling walker Ambulation/Gait Assistance Details: decr. UE support during gait. Gait Pattern: Step-through pattern Gait velocity: decr.    Exercises     PT Diagnosis: Difficulty walking;Acute pain  PT Problem List: Decreased activity tolerance;Decreased mobility;Decreased knowledge of use of DME;Decreased knowledge of precautions;Pain PT Treatment Interventions: DME instruction;Gait training;Stair training;Functional mobility training;Therapeutic activities;Therapeutic exercise;Patient/family  education   PT Goals Acute Rehab PT Goals PT Goal Formulation: With patient Time For Goal Achievement: 06/08/12 Potential to Achieve Goals: Good Pt will Roll Supine to Right Side: with modified independence PT Goal: Rolling Supine to Right Side - Progress: Goal set today Pt will Roll Supine to Left Side: with modified independence PT Goal: Rolling Supine to Left Side - Progress: Goal set today Pt will go Supine/Side to Sit: with modified independence PT Goal: Supine/Side to Sit - Progress: Progressing toward goal Pt will go Sit to Supine/Side: with modified independence PT Goal: Sit to Supine/Side - Progress: Goal set today Pt will go Sit to Stand: with modified independence PT Goal: Sit to Stand - Progress: Progressing toward goal Pt will go Stand to Sit: with modified independence PT Goal: Stand to Sit - Progress: Progressing toward goal Pt will Ambulate: >150 feet;with modified independence;with rolling walker PT Goal: Ambulate - Progress: Progressing toward goal Pt will Go Up / Down Stairs: 1-2 stairs;with supervision;with least restrictive assistive device PT Goal: Up/Down Stairs - Progress: Goal set today Additional Goals Additional Goal #1: demo 3/3 back precautions PT Goal: Additional Goal #1 - Progress: Progressing toward goal  Visit Information  Last PT Received On: 06/05/12 Assistance Needed: +1    Subjective Data  Subjective: I feel full but not urge to go to the bathroom. My leg is better than this am. Patient Stated Goal: to not have pain.   Cognition  Cognition Arousal/Alertness: Awake/alert Behavior During Therapy: Anxious Overall Cognitive Status: Within Functional Limits for tasks assessed    Balance     End of Session PT - End of Session Activity Tolerance: Patient tolerated treatment well Patient left: in chair Nurse Communication: Mobility status   GP Functional Assessment Tool Used: clinical judgement. Functional Limitation: Mobility: Walking  and  moving around Mobility: Walking and Moving Around Current Status [phone]): At least 20 percent but less than 40 percent impaired, limited or restricted Mobility: Walking and Moving Around Goal Status [phone]): At least 1 percent but less than 20 percent impaired, limited or restricted   Rada Hay 06/05/2012, 2:17 PM

## 2012-06-06 MED ORDER — METHOCARBAMOL 500 MG PO TABS: 500.0000 mg | ORAL_TABLET | Freq: Four times a day (QID) | ORAL | Status: AC | PRN

## 2012-06-06 MED ORDER — HYDROMORPHONE HCL 2 MG PO TABS: 2.0000 mg | ORAL_TABLET | ORAL | Status: AC | PRN

## 2012-06-06 NOTE — Progress Notes (Signed)
Physical Therapy Treatment Patient Details Name: Devon Young MRN: [id number] DOB: Oct 26, 1970 Today's Date: 06/06/2012 Time: 8657-8469 PT Time Calculation (min): 33 min  PT Assessment / Plan / Recommendation Comments on Treatment Session  POD # 2 L5 S1 Microdiscectomy.  Pt stated this is his 2nd back surgery and that this one is hurting "way more".  Pt very knowledgable about his back percautions.  Pt plans to stay with his mother.  Assisted pt OOB using "log roll" tech.  Amb in hallway then practiced steps using one rail and one crutch.     Follow Up Recommendations  Home health PT     Does the patient have the potential to tolerate intense rehabilitation     Barriers to Discharge        Equipment Recommendations  Rolling walker with 5" wheels    Recommendations for Other Services    Frequency 7X/week   Plan Discharge plan remains appropriate    Precautions / Restrictions     Pertinent Vitals/Pain C/o 8/10 pain with act Repositioned/applied ICE     Mobility  Bed Mobility Bed Mobility: Left Sidelying to Sit Rolling Left: 5: Supervision Left Sidelying to Sit: 5: Supervision Sit to Sidelying Left: 5: Supervision Details for Bed Mobility Assistance: pt able to manage getting self to sit up. cues for precautions.  Needed increased time. Transfers Transfers: Sit to Stand;Stand to Sit Sit to Stand: 5: Supervision;From bed;From elevated surface Stand to Sit: 5: Supervision;To chair/3-in-1 Details for Transfer Assistance: increased time and caution to B knees buckling Ambulation/Gait Ambulation/Gait Assistance: 5: Supervision Ambulation Distance (Feet): 25 Feet Assistive device: Rolling walker Ambulation/Gait Assistance Details: increased time and one VC on safety with turns Gait Pattern: Step-through pattern Gait velocity: decreased Stairs: Yes Stairs Assistance: 5: Supervision Stairs Assistance Details (indicate cue type and reason): 25% VC's on proper sequencing and  posture.  Crutch sizes and issued.  Stair Management Technique: One rail Right;Forwards;With crutches Number of Stairs: 2     PT Goals                                                            progressing    Visit Information  Last PT Received On: 06/06/12    Subjective Data      Cognition    good   Balance   fair  End of Session PT - End of Session Equipment Utilized During Treatment: Gait belt Activity Tolerance: Patient tolerated treatment well Patient left: in chair Nurse Communication: Mobility status   Felecia Shelling  PTA [facility]  Acute  Rehab Pager      [phone]

## 2012-06-06 NOTE — Progress Notes (Signed)
Occupational Therapy Treatment Patient Details Name: Devon Young MRN: [id number] DOB: 09-Jul-1970 Today's Date: 06/06/2012 Time: 0741-0800 OT Time Calculation (min): 19 min  OT Assessment / Plan / Recommendation Comments on Treatment Session      Follow Up Recommendations  No OT follow up    Barriers to Discharge       Equipment Recommendations  3 in 1 bedside comode    Recommendations for Other Services    Frequency Min 2X/week   Plan      Precautions / Restrictions Precautions Precautions: Back Restrictions Other Position/Activity Restrictions: wbat   Pertinent Vitals/Pain Back sore--premedicated and seen at bed level only today    ADL  Lower Body Dressing:  (socks with set up) Where Assessed - Lower Body Dressing: Supine, head of bed up (30 degrees) Transfers/Ambulation Related to ADLs: pt has been ambulating with PT.  Wanted to stay in bed until after breakfast.  ADL Comments: Reviewed all back education in adl context and gave handout.  Pt verbalizes all.  Educated on AE--pt did not feel he needed to practice with reacher:  plans to get AE. Showed toilet aid; pt will likely not need.  Also reviewed tub readiness/method for getting in/out    OT Diagnosis:    OT Problem List:   OT Treatment Interventions:     OT Goals ADL Goals Pt Will Perform Lower Body Dressing: with min assist;Sit to stand from chair;with adaptive equipment ADL Goal: Lower Body Dressing - Progress: Partly met Miscellaneous OT Goals Miscellaneous OT Goal #1: Pt will verbalize tub readiness/sequence OT Goal: Miscellaneous Goal #1 - Progress: Met  Visit Information  Last OT Received On: 06/06/12 Assistance Needed: +1    Subjective Data      Prior Functioning       Cognition  Cognition Arousal/Alertness: Awake/alert Behavior During Therapy: Anxious Overall Cognitive Status: Within Functional Limits for tasks assessed    Mobility       Exercises      Balance     End of Session  OT - End of Session Patient left: in bed;with call bell/phone within reach  GO     [facility] 06/06/2012, 8:13 AM Marica Otter, OTR/L [phone] 06/06/2012

## 2012-06-06 NOTE — Progress Notes (Signed)
Received order for commode. Delivered to room prior to d/c.

## 2012-06-07 NOTE — Discharge Summary (Signed)
NAMEDARLENE, BARTELT              ACCOUNT NO.:  [id number]  MEDICAL RECORD NO.:  [id number]  LOCATION:  1606                         FACILITY:  [facility]  PHYSICIAN:  Marlowe Kays, M.D.  DATE OF BIRTH:  July 30, 1970  DATE OF ADMISSION:  06/04/2012 DATE OF DISCHARGE:  06/06/2012                              DISCHARGE SUMMARY   ADMITTING DIAGNOSES:  Recurrent herniated nucleus pulposus L5-S1, left.  DISCHARGE DIAGNOSIS:  Recurrent herniated nucleus pulposus L5-S1, left.  OPERATION:  Microdiskectomy with excision of recurrent herniated nucleus pulposus L5-S1, left on Jun 04, 2012.  SUMMARY:  Mr. Gillentine is a long-time patient of mine.  Ten years ago, he had performed a microdiskectomy L5-S1 on the left and he has done well until recently with progressive pain in his back and left leg with numbness in the little toe.  He was admitted at this time for surgical correction.  The night before surgery, he had had a little bit of diarrhea, which apparently contracted from his mother who had it the day before, but he was afebrile.  I made the decision because of his admission that he was with pain and neurologic deficit that we should go ahead with the planned surgery, which did go uneventfully.  His postoperative course has been unremarkable.  At the time of discharge, his wound was clean and dry.  He was afebrile.  He was ambulatory and was urinating independently.  He was given postoperative instructions and will be given prescriptions for Dilaudid 2 mg and Robaxin 500 mg to return to my office 2 weeks from surgery, but to see me sooner if there is any problem.  He is advised that he can shower in 3 days, if there is no wound drainage, otherwise to keep the incision dressed.          ______________________________ Marlowe Kays, M.D.     JA/MEDQ  D:  06/06/2012  T:  06/07/2012  Job:  [id number]

## 2012-06-26 NOTE — Addendum Note (Signed)
Addendum created 06/26/12 1610 by Valeda Malm, CRNA   Modules edited: Anesthesia Medication Administration

## 2013-11-11 IMAGING — CR DG SPINE 1V PORT
1 series · 1 of 1 positions shown · non-contrast
Comparison: None.

CLINICAL DATA: Back pain

PORTABLE SPINE - 1 VIEW

[lateral]
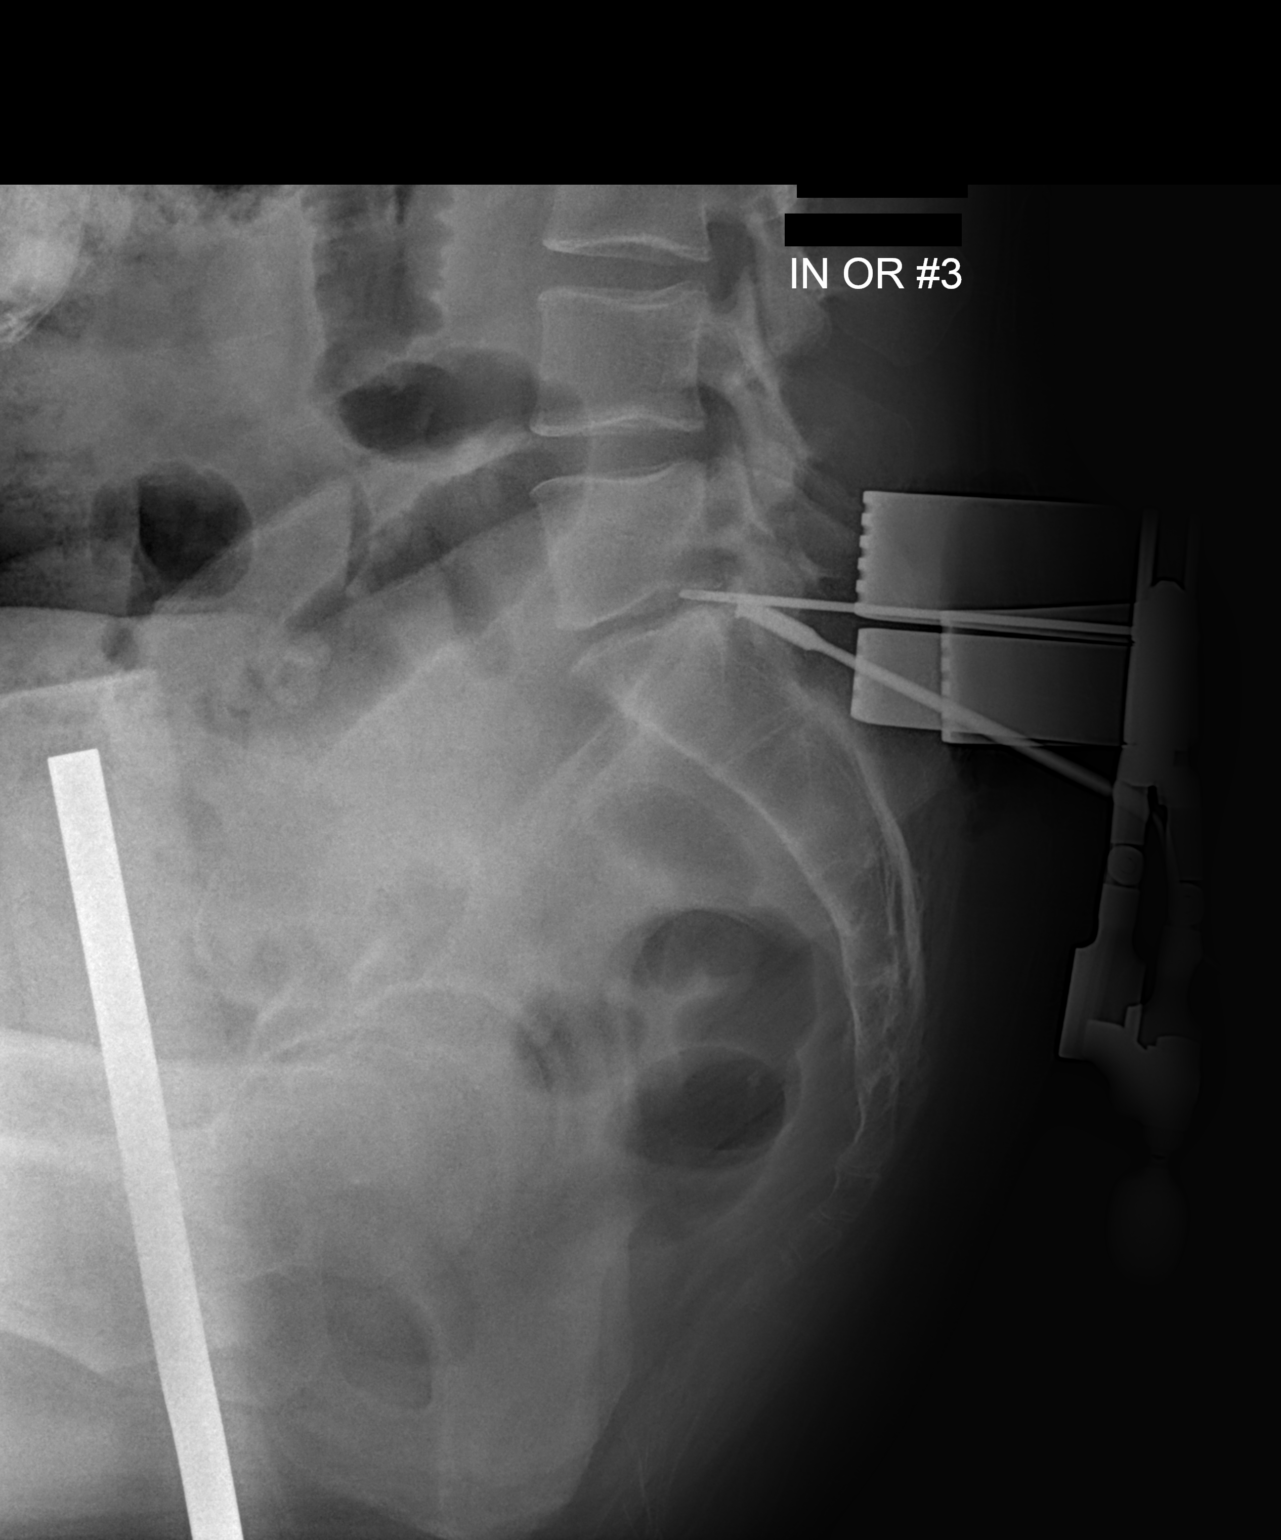

[1 of 1 positions shown; findings below may reference images not displayed]

FINDINGS: Intraoperative lateral film #3 demonstrates a blunt
instrument within the L5-S1 interspace.
IMPRESSION: L5-S1 marked.

## 2022-02-02 DIAGNOSIS — F1120 Opioid dependence, uncomplicated: Secondary | ICD-10-CM | POA: Diagnosis not present

## 2022-02-23 DIAGNOSIS — F1120 Opioid dependence, uncomplicated: Secondary | ICD-10-CM | POA: Diagnosis not present

## 2022-04-06 DIAGNOSIS — F1120 Opioid dependence, uncomplicated: Secondary | ICD-10-CM | POA: Diagnosis not present

## 2022-05-04 DIAGNOSIS — F1120 Opioid dependence, uncomplicated: Secondary | ICD-10-CM | POA: Diagnosis not present

## 2022-05-22 DIAGNOSIS — F1120 Opioid dependence, uncomplicated: Secondary | ICD-10-CM | POA: Diagnosis not present

## 2022-06-01 DIAGNOSIS — F1120 Opioid dependence, uncomplicated: Secondary | ICD-10-CM | POA: Diagnosis not present

## 2022-06-29 DIAGNOSIS — F1120 Opioid dependence, uncomplicated: Secondary | ICD-10-CM | POA: Diagnosis not present

## 2022-08-02 DIAGNOSIS — F1120 Opioid dependence, uncomplicated: Secondary | ICD-10-CM | POA: Diagnosis not present

## 2022-08-24 DIAGNOSIS — F1120 Opioid dependence, uncomplicated: Secondary | ICD-10-CM | POA: Diagnosis not present

## 2022-08-29 DIAGNOSIS — F1120 Opioid dependence, uncomplicated: Secondary | ICD-10-CM | POA: Diagnosis not present

## 2022-09-06 DIAGNOSIS — N5201 Erectile dysfunction due to arterial insufficiency: Secondary | ICD-10-CM | POA: Diagnosis not present

## 2022-09-06 DIAGNOSIS — E291 Testicular hypofunction: Secondary | ICD-10-CM | POA: Diagnosis not present

## 2022-09-06 DIAGNOSIS — E230 Hypopituitarism: Secondary | ICD-10-CM | POA: Diagnosis not present

## 2022-10-05 DIAGNOSIS — F1120 Opioid dependence, uncomplicated: Secondary | ICD-10-CM | POA: Diagnosis not present

## 2022-10-18 DIAGNOSIS — F1120 Opioid dependence, uncomplicated: Secondary | ICD-10-CM | POA: Diagnosis not present

## 2022-11-02 DIAGNOSIS — F1120 Opioid dependence, uncomplicated: Secondary | ICD-10-CM | POA: Diagnosis not present

## 2022-11-20 DIAGNOSIS — F1120 Opioid dependence, uncomplicated: Secondary | ICD-10-CM | POA: Diagnosis not present

## 2022-11-30 DIAGNOSIS — F1120 Opioid dependence, uncomplicated: Secondary | ICD-10-CM | POA: Diagnosis not present

## 2022-12-28 DIAGNOSIS — F1120 Opioid dependence, uncomplicated: Secondary | ICD-10-CM | POA: Diagnosis not present

## 2023-01-28 ENCOUNTER — Encounter (HOSPITAL_COMMUNITY): Payer: Self-pay | Admitting: Emergency Medicine

## 2023-01-28 ENCOUNTER — Ambulatory Visit (HOSPITAL_COMMUNITY)
Admission: EM | Admit: 2023-01-28 | Discharge: 2023-01-28 | Disposition: A | Discharge status: Home / Self Care | Payer: 59 | Attending: Physician Assistant | Admitting: Physician Assistant

## 2023-01-28 DIAGNOSIS — N492 Inflammatory disorders of scrotum: Secondary | ICD-10-CM

## 2023-01-28 MED ORDER — SULFAMETHOXAZOLE-TRIMETHOPRIM 800-160 MG PO TABS: 1.0000 | ORAL_TABLET | Freq: Two times a day (BID) | ORAL | 0 refills | Status: AC

## 2023-01-28 NOTE — Discharge Instructions (Addendum)
 Take antibiotics as prescribed Follow up with PCP or return if no improvement or worsening symptoms

## 2023-01-28 NOTE — ED Triage Notes (Signed)
 Pt has ingrown hair on right testicle for week that has gotten swollen and painful. Reports been using heat and this morning it popped and drained.
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
## 2023-02-01 DIAGNOSIS — F1120 Opioid dependence, uncomplicated: Secondary | ICD-10-CM | POA: Diagnosis not present

## 2023-02-08 DIAGNOSIS — F1120 Opioid dependence, uncomplicated: Secondary | ICD-10-CM | POA: Diagnosis not present

## 2023-03-08 DIAGNOSIS — F1120 Opioid dependence, uncomplicated: Secondary | ICD-10-CM | POA: Diagnosis not present

## 2023-04-05 DIAGNOSIS — F1120 Opioid dependence, uncomplicated: Secondary | ICD-10-CM | POA: Diagnosis not present

## 2023-05-03 DIAGNOSIS — F1120 Opioid dependence, uncomplicated: Secondary | ICD-10-CM | POA: Diagnosis not present

## 2023-05-07 DIAGNOSIS — F1120 Opioid dependence, uncomplicated: Secondary | ICD-10-CM | POA: Diagnosis not present

## 2023-05-31 DIAGNOSIS — F1120 Opioid dependence, uncomplicated: Secondary | ICD-10-CM | POA: Diagnosis not present

## 2023-06-28 DIAGNOSIS — F1120 Opioid dependence, uncomplicated: Secondary | ICD-10-CM | POA: Diagnosis not present

## 2023-07-26 DIAGNOSIS — F1120 Opioid dependence, uncomplicated: Secondary | ICD-10-CM | POA: Diagnosis not present

## 2023-07-31 DIAGNOSIS — F1120 Opioid dependence, uncomplicated: Secondary | ICD-10-CM | POA: Diagnosis not present

## 2023-08-29 DIAGNOSIS — E230 Hypopituitarism: Secondary | ICD-10-CM | POA: Diagnosis not present

## 2023-09-06 DIAGNOSIS — F1120 Opioid dependence, uncomplicated: Secondary | ICD-10-CM | POA: Diagnosis not present

## 2023-09-27 DIAGNOSIS — F1120 Opioid dependence, uncomplicated: Secondary | ICD-10-CM | POA: Diagnosis not present

## 2023-10-18 DIAGNOSIS — F1120 Opioid dependence, uncomplicated: Secondary | ICD-10-CM | POA: Diagnosis not present

## 2023-10-25 DIAGNOSIS — F1120 Opioid dependence, uncomplicated: Secondary | ICD-10-CM | POA: Diagnosis not present

## 2023-11-28 DIAGNOSIS — F1120 Opioid dependence, uncomplicated: Secondary | ICD-10-CM | POA: Diagnosis not present

## 2023-12-03 DIAGNOSIS — E291 Testicular hypofunction: Secondary | ICD-10-CM | POA: Diagnosis not present

## 2023-12-06 DIAGNOSIS — F1120 Opioid dependence, uncomplicated: Secondary | ICD-10-CM | POA: Diagnosis not present

## 2023-12-10 DIAGNOSIS — E291 Testicular hypofunction: Secondary | ICD-10-CM | POA: Diagnosis not present

## 2024-01-03 DIAGNOSIS — F1120 Opioid dependence, uncomplicated: Secondary | ICD-10-CM | POA: Diagnosis not present
